# Patient Record
Sex: Female | Born: 1995 | Hispanic: Yes | Marital: Single | Smoking: Never smoker
Health system: Southern US, Community
[De-identification: ages and names within clinical notes are randomized; demographics above are authoritative.]

## PROBLEM LIST (undated history)

## (undated) DIAGNOSIS — I10 Essential (primary) hypertension: Secondary | ICD-10-CM

---

## 2020-02-26 ENCOUNTER — Other Ambulatory Visit: Payer: Self-pay

## 2020-02-26 ENCOUNTER — Emergency Department (HOSPITAL_BASED_OUTPATIENT_CLINIC_OR_DEPARTMENT_OTHER): Payer: 59

## 2020-02-26 ENCOUNTER — Emergency Department (HOSPITAL_BASED_OUTPATIENT_CLINIC_OR_DEPARTMENT_OTHER)
Admission: EM | Admit: 2020-02-26 | Discharge: 2020-02-26 | Disposition: A | Discharge status: Home / Self Care | Payer: 59 | Attending: Emergency Medicine | Admitting: Emergency Medicine

## 2020-02-26 ENCOUNTER — Encounter (HOSPITAL_BASED_OUTPATIENT_CLINIC_OR_DEPARTMENT_OTHER): Payer: Self-pay | Admitting: Emergency Medicine

## 2020-02-26 DIAGNOSIS — O99891 Other specified diseases and conditions complicating pregnancy: Secondary | ICD-10-CM | POA: Insufficient documentation

## 2020-02-26 DIAGNOSIS — O210 Mild hyperemesis gravidarum: Secondary | ICD-10-CM

## 2020-02-26 DIAGNOSIS — Y999 Unspecified external cause status: Secondary | ICD-10-CM | POA: Diagnosis not present

## 2020-02-26 DIAGNOSIS — H53149 Visual discomfort, unspecified: Secondary | ICD-10-CM | POA: Insufficient documentation

## 2020-02-26 DIAGNOSIS — O4691 Antepartum hemorrhage, unspecified, first trimester: Secondary | ICD-10-CM | POA: Insufficient documentation

## 2020-02-26 DIAGNOSIS — E876 Hypokalemia: Secondary | ICD-10-CM

## 2020-02-26 DIAGNOSIS — O2691 Pregnancy related conditions, unspecified, first trimester: Secondary | ICD-10-CM | POA: Diagnosis not present

## 2020-02-26 DIAGNOSIS — R42 Dizziness and giddiness: Secondary | ICD-10-CM | POA: Diagnosis not present

## 2020-02-26 DIAGNOSIS — X58XXXA Exposure to other specified factors, initial encounter: Secondary | ICD-10-CM | POA: Diagnosis not present

## 2020-02-26 DIAGNOSIS — R519 Headache, unspecified: Secondary | ICD-10-CM | POA: Diagnosis not present

## 2020-02-26 DIAGNOSIS — S01112A Laceration without foreign body of left eyelid and periocular area, initial encounter: Secondary | ICD-10-CM | POA: Diagnosis not present

## 2020-02-26 DIAGNOSIS — Z3A08 8 weeks gestation of pregnancy: Secondary | ICD-10-CM | POA: Diagnosis not present

## 2020-02-26 DIAGNOSIS — Y939 Activity, unspecified: Secondary | ICD-10-CM | POA: Insufficient documentation

## 2020-02-26 DIAGNOSIS — Y929 Unspecified place or not applicable: Secondary | ICD-10-CM | POA: Diagnosis not present

## 2020-02-26 DIAGNOSIS — O211 Hyperemesis gravidarum with metabolic disturbance: Secondary | ICD-10-CM | POA: Insufficient documentation

## 2020-02-26 DIAGNOSIS — S0990XA Unspecified injury of head, initial encounter: Secondary | ICD-10-CM | POA: Insufficient documentation

## 2020-02-26 LAB — URINALYSIS, ROUTINE W REFLEX MICROSCOPIC
Bilirubin Urine: NEGATIVE
Glucose, UA: NEGATIVE mg/dL
Hgb urine dipstick: NEGATIVE
Ketones, ur: >80 mg/dL — AB
Leukocytes,Ua: NEGATIVE
Nitrite: NEGATIVE
Protein, ur: NEGATIVE mg/dL
Specific Gravity, Urine: <1.005 — ABNORMAL LOW (ref 1.005–1.030)
pH: 6.5 (ref 5.0–8.0)

## 2020-02-26 LAB — CBC WITH DIFFERENTIAL/PLATELET
Abs Immature Granulocytes: 0.05 10*3/uL (ref 0.00–0.07)
Basophils Absolute: 0 10*3/uL (ref 0.0–0.1)
Basophils Relative: 0 %
Eosinophils Absolute: 0 10*3/uL (ref 0.0–0.5)
Eosinophils Relative: 0 %
HCT: 39.4 % (ref 36.0–46.0)
Hemoglobin: 13.5 g/dL (ref 12.0–15.0)
Immature Granulocytes: 1 %
Lymphocytes Relative: 16 %
Lymphs Abs: 1.7 10*3/uL (ref 0.7–4.0)
MCH: 29.7 pg (ref 26.0–34.0)
MCHC: 34.3 g/dL (ref 30.0–36.0)
MCV: 86.6 fL (ref 80.0–100.0)
Monocytes Absolute: 0.9 10*3/uL (ref 0.1–1.0)
Monocytes Relative: 8 %
Neutro Abs: 8.2 10*3/uL — ABNORMAL HIGH (ref 1.7–7.7)
Neutrophils Relative %: 75 %
Platelets: 321 10*3/uL (ref 150–400)
RBC: 4.55 MIL/uL (ref 3.87–5.11)
RDW: 13.9 % (ref 11.5–15.5)
WBC: 10.9 10*3/uL — ABNORMAL HIGH (ref 4.0–10.5)
nRBC: 0 % (ref 0.0–0.2)

## 2020-02-26 LAB — BASIC METABOLIC PANEL
Anion gap: 14 (ref 5–15)
BUN: 8 mg/dL (ref 6–20)
CO2: 25 mmol/L (ref 22–32)
Calcium: 9.7 mg/dL (ref 8.9–10.3)
Chloride: 94 mmol/L — ABNORMAL LOW (ref 98–111)
Creatinine, Ser: 0.73 mg/dL (ref 0.44–1.00)
GFR calc Af Amer: >60 mL/min (ref >60)
GFR calc non Af Amer: >60 mL/min (ref >60)
Glucose, Bld: 111 mg/dL — ABNORMAL HIGH (ref 70–99)
Potassium: 3 mmol/L — ABNORMAL LOW (ref 3.5–5.1)
Sodium: 133 mmol/L — ABNORMAL LOW (ref 135–145)

## 2020-02-26 MED ORDER — POTASSIUM CHLORIDE CRYS ER 20 MEQ PO TBCR
40.0000 meq | EXTENDED_RELEASE_TABLET | Freq: Once | ORAL | Status: AC
  Administered 2020-02-26: 40 meq via ORAL
  Filled 2020-02-26: qty 2

## 2020-02-26 MED ORDER — SODIUM CHLORIDE 0.9 % IV BOLUS
1000.0000 mL | Freq: Once | INTRAVENOUS | Status: AC
  Administered 2020-02-26: 1000 mL via INTRAVENOUS

## 2020-02-26 MED ORDER — MECLIZINE HCL 25 MG PO TABS
25.0000 mg | ORAL_TABLET | Freq: Once | ORAL | Status: AC
  Administered 2020-02-26: 25 mg via ORAL
  Filled 2020-02-26: qty 1

## 2020-02-26 MED ORDER — SODIUM CHLORIDE 0.9 % IV SOLN
INTRAVENOUS | Status: DC | PRN
  Administered 2020-02-26: 500 mL via INTRAVENOUS

## 2020-02-26 MED ORDER — ONDANSETRON HCL 4 MG/2ML IJ SOLN
4.0000 mg | Freq: Once | INTRAMUSCULAR | Status: AC
  Administered 2020-02-26: 4 mg via INTRAVENOUS
  Filled 2020-02-26: qty 2

## 2020-02-26 MED ORDER — DIPHENHYDRAMINE HCL 50 MG/ML IJ SOLN
25.0000 mg | Freq: Once | INTRAMUSCULAR | Status: AC
  Administered 2020-02-26: 25 mg via INTRAVENOUS
  Filled 2020-02-26: qty 1

## 2020-02-26 MED ORDER — METOCLOPRAMIDE HCL 5 MG/ML IJ SOLN
10.0000 mg | Freq: Once | INTRAMUSCULAR | Status: AC
  Administered 2020-02-26: 10 mg via INTRAVENOUS
  Filled 2020-02-26: qty 2

## 2020-02-26 MED ORDER — POTASSIUM CHLORIDE 10 MEQ/100ML IV SOLN
10.0000 meq | Freq: Once | INTRAVENOUS | Status: AC
  Administered 2020-02-26: 10 meq via INTRAVENOUS
  Filled 2020-02-26: qty 100

## 2020-02-26 NOTE — ED Notes (Signed)
Pt placed on cardiac monitor 

## 2020-02-26 NOTE — Discharge Instructions (Signed)
You were seen today for nausea and vomiting.  This is likely related to your ongoing hyperemesis.  Take your medications as prescribed.  Regarding your head CT, this is negative for acute trauma.

## 2020-02-26 NOTE — ED Notes (Addendum)
Pt states she is ready for discharge.Pt has not had any vomiting, even with PO intake, since triage. EDP made aware.
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
## 2020-02-27 ENCOUNTER — Encounter (HOSPITAL_BASED_OUTPATIENT_CLINIC_OR_DEPARTMENT_OTHER): Payer: Self-pay

## 2020-02-27 ENCOUNTER — Emergency Department (HOSPITAL_BASED_OUTPATIENT_CLINIC_OR_DEPARTMENT_OTHER): Payer: 59

## 2020-02-27 ENCOUNTER — Emergency Department (HOSPITAL_BASED_OUTPATIENT_CLINIC_OR_DEPARTMENT_OTHER)
Admission: EM | Admit: 2020-02-27 | Discharge: 2020-02-27 | Disposition: A | Discharge status: Home / Self Care | Payer: 59 | Attending: Emergency Medicine | Admitting: Emergency Medicine

## 2020-02-27 ENCOUNTER — Other Ambulatory Visit: Payer: Self-pay

## 2020-02-27 DIAGNOSIS — O210 Mild hyperemesis gravidarum: Secondary | ICD-10-CM | POA: Insufficient documentation

## 2020-02-27 DIAGNOSIS — O10011 Pre-existing essential hypertension complicating pregnancy, first trimester: Secondary | ICD-10-CM | POA: Insufficient documentation

## 2020-02-27 DIAGNOSIS — O26891 Other specified pregnancy related conditions, first trimester: Secondary | ICD-10-CM | POA: Diagnosis present

## 2020-02-27 DIAGNOSIS — Z3A08 8 weeks gestation of pregnancy: Secondary | ICD-10-CM | POA: Insufficient documentation

## 2020-02-27 HISTORY — DX: Essential (primary) hypertension: I10

## 2020-02-27 LAB — COMPREHENSIVE METABOLIC PANEL
ALT: 21 U/L (ref 0–44)
AST: 19 U/L (ref 15–41)
Albumin: 4.1 g/dL (ref 3.5–5.0)
Alkaline Phosphatase: 36 U/L — ABNORMAL LOW (ref 38–126)
Anion gap: 10 (ref 5–15)
BUN: 7 mg/dL (ref 6–20)
CO2: 22 mmol/L (ref 22–32)
Calcium: 8.4 mg/dL — ABNORMAL LOW (ref 8.9–10.3)
Chloride: 101 mmol/L (ref 98–111)
Creatinine, Ser: 0.67 mg/dL (ref 0.44–1.00)
GFR calc Af Amer: >60 mL/min (ref >60)
GFR calc non Af Amer: >60 mL/min (ref >60)
Glucose, Bld: 101 mg/dL — ABNORMAL HIGH (ref 70–99)
Potassium: 3.3 mmol/L — ABNORMAL LOW (ref 3.5–5.1)
Sodium: 133 mmol/L — ABNORMAL LOW (ref 135–145)
Total Bilirubin: 0.7 mg/dL (ref 0.3–1.2)
Total Protein: 6.9 g/dL (ref 6.5–8.1)

## 2020-02-27 LAB — URINALYSIS, ROUTINE W REFLEX MICROSCOPIC
Bilirubin Urine: NEGATIVE
Glucose, UA: NEGATIVE mg/dL
Hgb urine dipstick: NEGATIVE
Ketones, ur: >80 mg/dL — AB
Leukocytes,Ua: NEGATIVE
Nitrite: NEGATIVE
Protein, ur: NEGATIVE mg/dL
Specific Gravity, Urine: 1.015 (ref 1.005–1.030)
pH: >9 — ABNORMAL HIGH (ref 5.0–8.0)

## 2020-02-27 LAB — CBC WITH DIFFERENTIAL/PLATELET
Abs Immature Granulocytes: 0.03 10*3/uL (ref 0.00–0.07)
Basophils Absolute: 0 10*3/uL (ref 0.0–0.1)
Basophils Relative: 0 %
Eosinophils Absolute: 0 10*3/uL (ref 0.0–0.5)
Eosinophils Relative: 0 %
HCT: 37.4 % (ref 36.0–46.0)
Hemoglobin: 13 g/dL (ref 12.0–15.0)
Immature Granulocytes: 0 %
Lymphocytes Relative: 14 %
Lymphs Abs: 1.4 10*3/uL (ref 0.7–4.0)
MCH: 30.4 pg (ref 26.0–34.0)
MCHC: 34.8 g/dL (ref 30.0–36.0)
MCV: 87.4 fL (ref 80.0–100.0)
Monocytes Absolute: 0.6 10*3/uL (ref 0.1–1.0)
Monocytes Relative: 6 %
Neutro Abs: 7.6 10*3/uL (ref 1.7–7.7)
Neutrophils Relative %: 80 %
Platelets: 291 10*3/uL (ref 150–400)
RBC: 4.28 MIL/uL (ref 3.87–5.11)
RDW: 13.9 % (ref 11.5–15.5)
WBC: 9.7 10*3/uL (ref 4.0–10.5)
nRBC: 0 % (ref 0.0–0.2)

## 2020-02-27 LAB — HCG, QUANTITATIVE, PREGNANCY: hCG, Beta Chain, Quant, S: 115387 m[IU]/mL — ABNORMAL HIGH (ref <5)

## 2020-02-27 LAB — LIPASE, BLOOD: Lipase: 31 U/L (ref 11–51)

## 2020-02-27 MED ORDER — SODIUM CHLORIDE 0.9 % IV BOLUS
1000.0000 mL | Freq: Once | INTRAVENOUS | Status: AC
  Administered 2020-02-27: 1000 mL via INTRAVENOUS

## 2020-02-27 MED ORDER — POTASSIUM CHLORIDE CRYS ER 20 MEQ PO TBCR
40.0000 meq | EXTENDED_RELEASE_TABLET | Freq: Once | ORAL | Status: DC
  Filled 2020-02-27: qty 2

## 2020-02-27 MED ORDER — ONDANSETRON HCL 4 MG/2ML IJ SOLN
4.0000 mg | Freq: Once | INTRAMUSCULAR | Status: AC
  Administered 2020-02-27: 4 mg via INTRAVENOUS
  Filled 2020-02-27: qty 2

## 2020-02-27 MED ORDER — METOCLOPRAMIDE HCL 5 MG/ML IJ SOLN
10.0000 mg | Freq: Once | INTRAMUSCULAR | Status: AC
  Administered 2020-02-27: 10 mg via INTRAVENOUS
  Filled 2020-02-27: qty 2

## 2020-02-27 NOTE — ED Notes (Signed)
PT given water.
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
## 2020-02-27 NOTE — Discharge Instructions (Addendum)
Please take your home nausea medications as prescribed. Follow up with your OBGYN regarding your hyperemesis as this is likely the cause of your symptoms today.

## 2020-02-27 NOTE — ED Notes (Signed)
Patient transported to Ultrasound 

## 2020-02-27 NOTE — ED Notes (Signed)
Attempted x 1 to start IV; unable to obtain

## 2020-02-27 NOTE — ED Notes (Signed)
PT aware of the need for UA 

## 2020-02-27 NOTE — ED Triage Notes (Addendum)
Pt arrives to ED with c/o abdominal pain states she was seen here yesterday reports some vaginal bleeding 3 days ago denies any today. Pt vomiting during triage. Pt reports having to terminate pregnancy in past r/t placenta problems.
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
## 2021-05-26 IMAGING — US US OB COMP LESS 14 WK
1 series · 14 of 28 positions shown · non-contrast
Comparison: None.

CLINICAL DATA: Pelvic pain

EXAM:
OBSTETRIC <14 WK US AND TRANSVAGINAL OB US
TECHNIQUE: Both transabdominal and transvaginal ultrasound examinations were
performed for complete evaluation of the gestation as well as the
maternal uterus, adnexal regions, and pelvic cul-de-sac.
Transvaginal technique was performed to assess early pregnancy.

[Series 1: us ob comp less 14 wk · 14 of 159 slices shown]
[im 6/159]
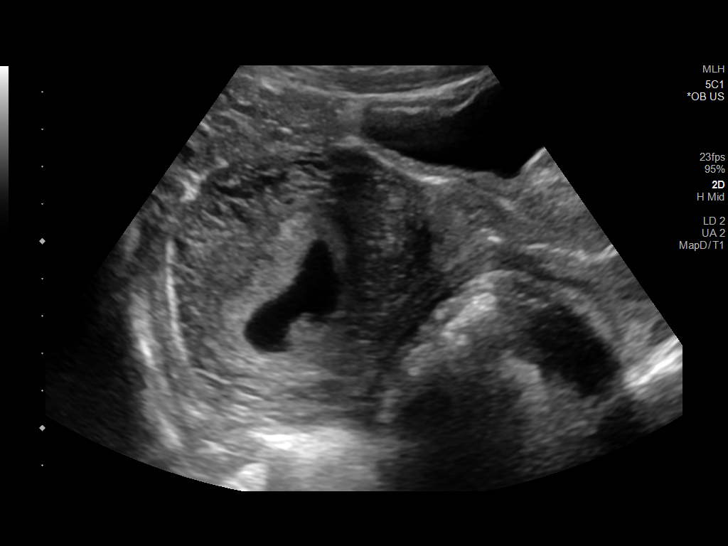
[im 18/159]
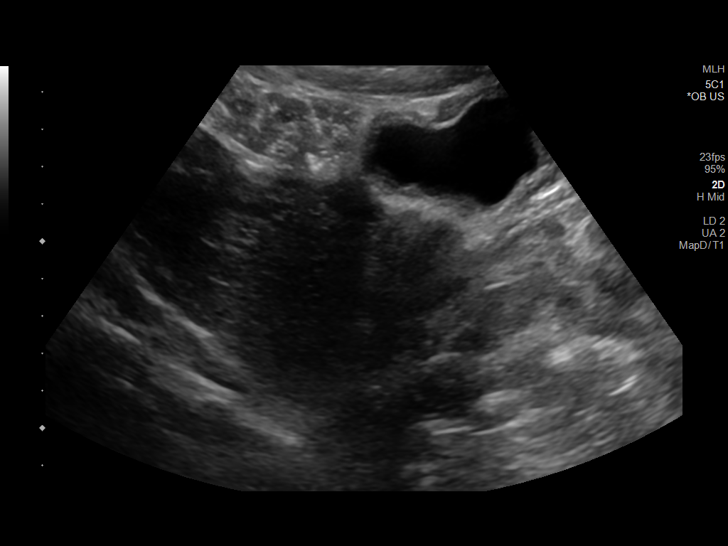
[im 30/159]
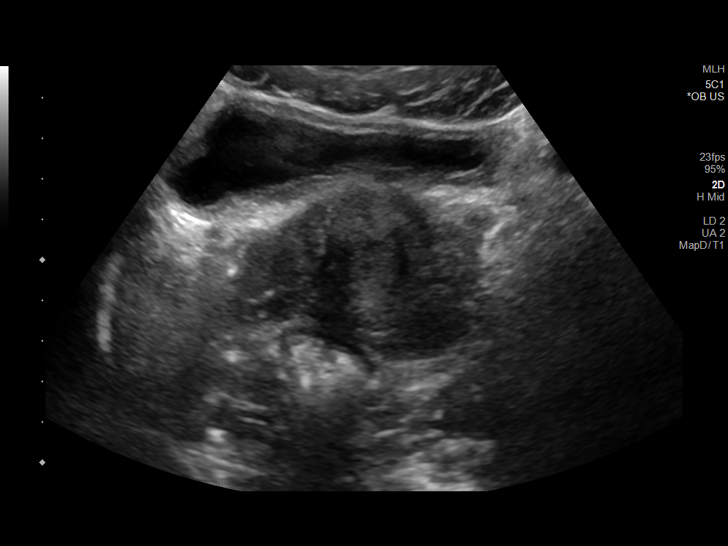
[im 41/159]
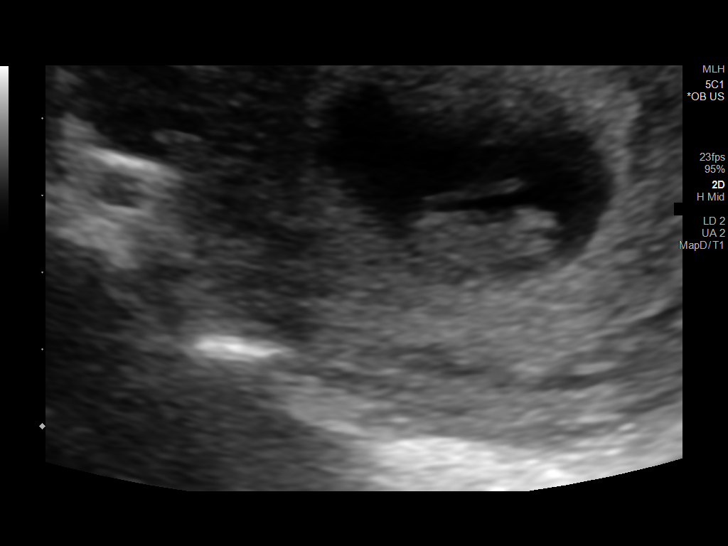
[im 53/159]
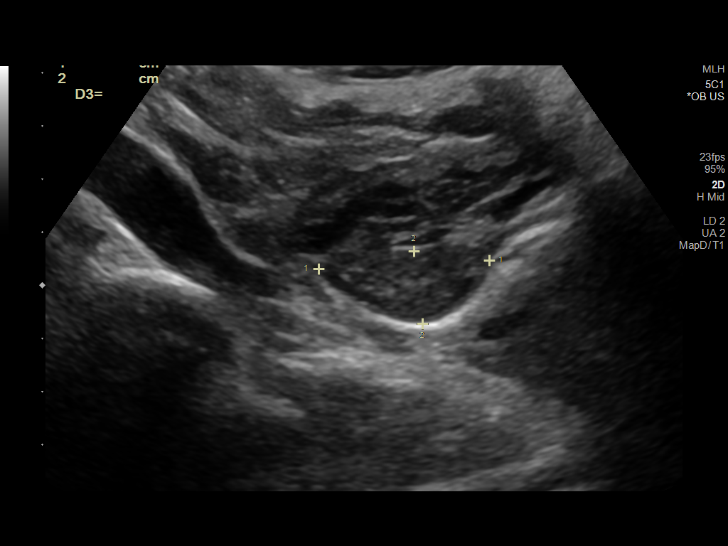
[im 65/159]
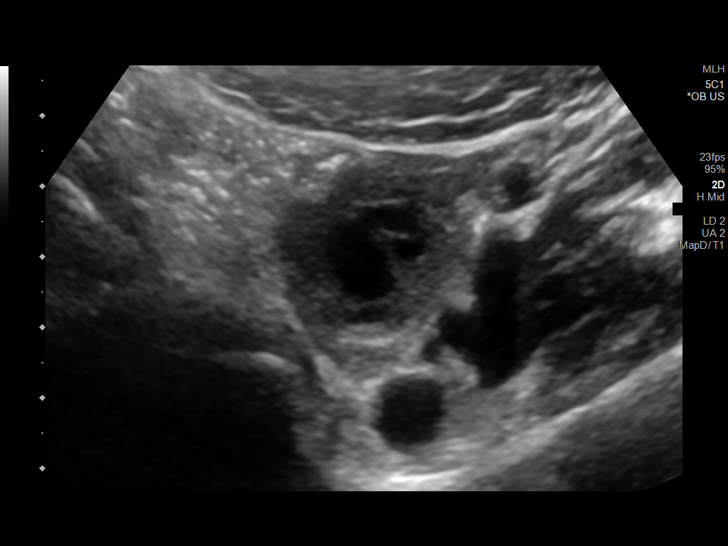
[im 77/159]
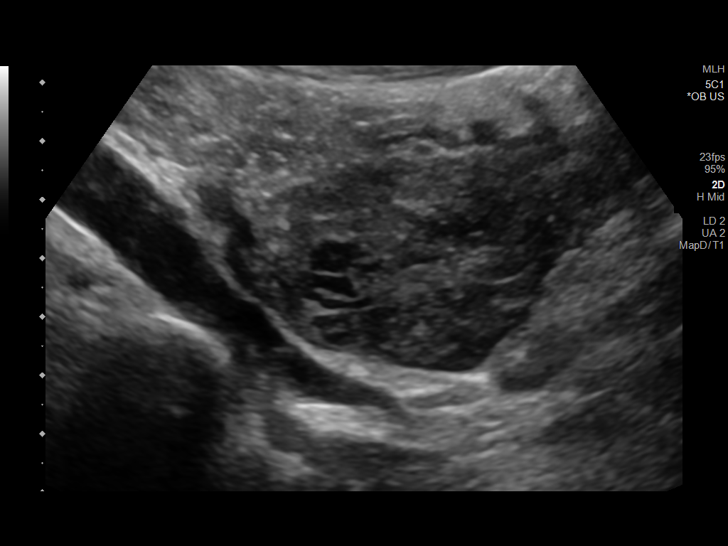
[im 88/159]
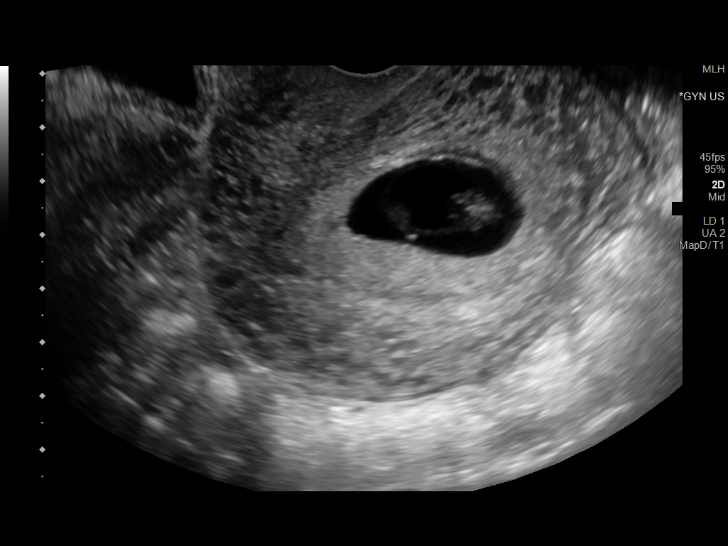
[im 100/159]
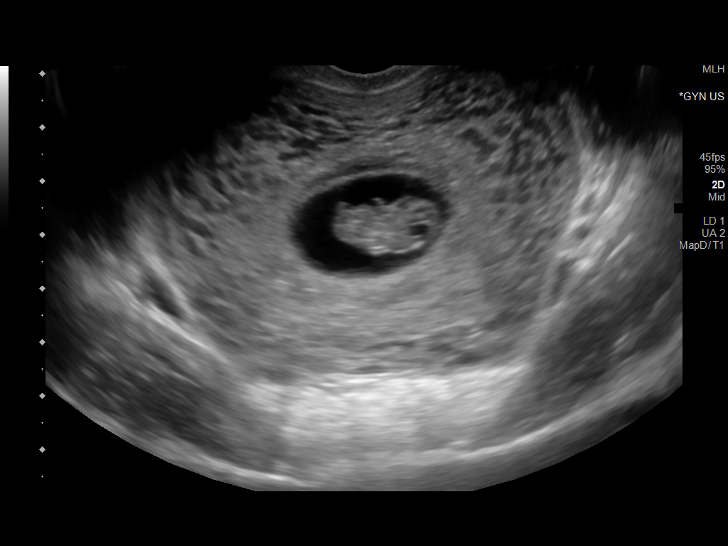
[im 112/159]
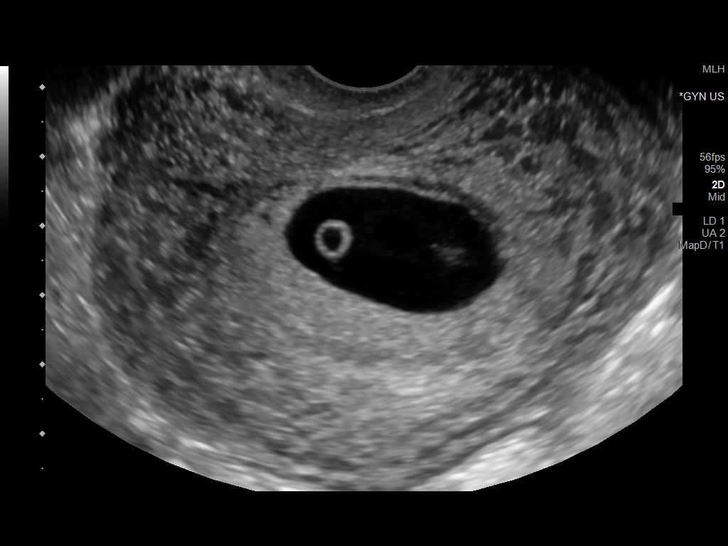
[im 123/159]
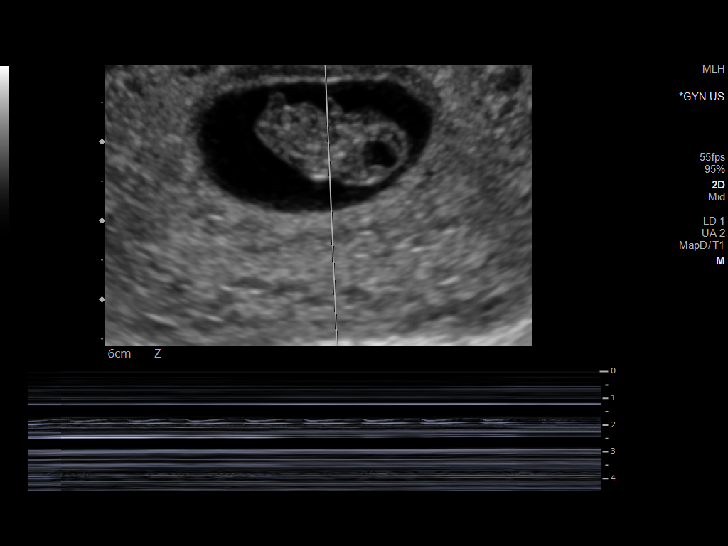
[im 135/159]
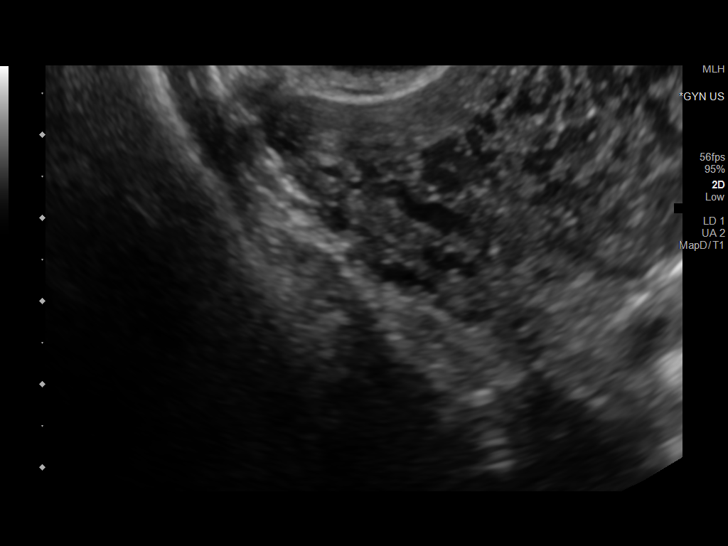
[im 147/159]
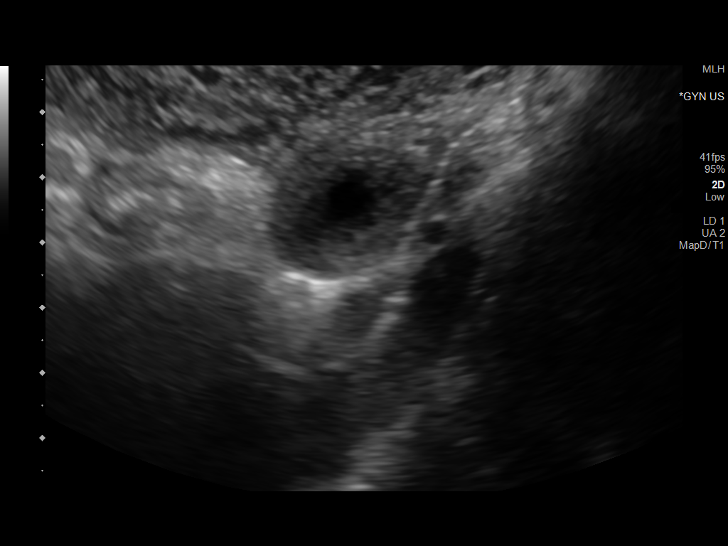
[im 159/159]
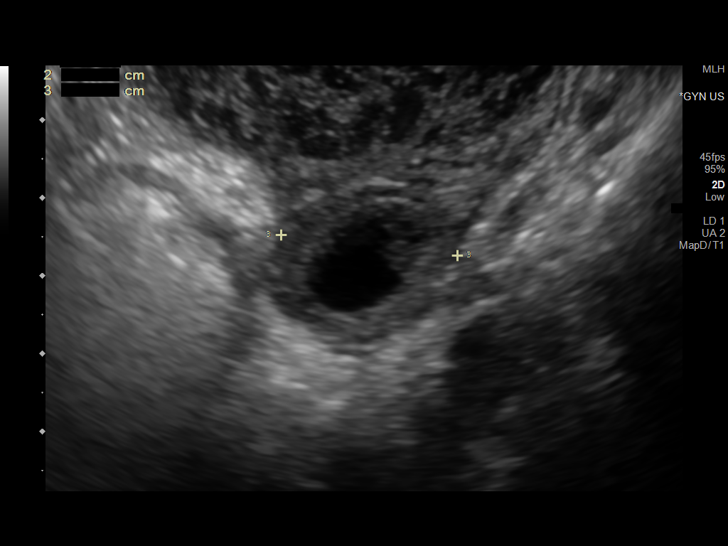

[14 of 28 positions shown; findings below may reference images not displayed]

FINDINGS: Intrauterine gestational sac: Visualized

Yolk sac:  Visualized

Embryo:  Visualized

Cardiac Activity: Visualized

Heart Rate: 178 bpm

CRL:  21 mm   8 w   5 d                  US EDC: October 03, 2020

Subchorionic hemorrhage:  None visualized.

Maternal uterus/adnexae: Cervical os is closed. Right ovary measures
2.4 x 1.1 x 2.6 cm. Left ovary measures 2.7 x 1.9 x 2.3 cm. There is
a somewhat complex cyst in the left ovary measuring 1.9 x 1.1 x
cm. No free pelvic fluid evident.
IMPRESSION: Single live intrauterine gestation with estimated gestational age of
9-weeks. No subchorionic hemorrhage evident.

Probable hemorrhagic corpus luteum on the left measuring 1.9 x 1.1 x
1.5 cm. Particular attention this area on subsequent evaluations
warranted.
